# Patient Record
Sex: Male | Born: 1975 | Race: White | Hispanic: No | Marital: Married | Smoking: Never smoker
Health system: Southern US, Community
[De-identification: ages and names within clinical notes are randomized; demographics above are authoritative.]

---

## 1999-08-20 ENCOUNTER — Emergency Department (HOSPITAL_COMMUNITY): Admission: EM | Admit: 1999-08-20 | Discharge: 1999-08-21 | Payer: Self-pay | Admitting: Emergency Medicine

## 2004-01-07 ENCOUNTER — Emergency Department (HOSPITAL_COMMUNITY): Admission: EM | Admit: 2004-01-07 | Discharge: 2004-01-07 | Payer: Self-pay | Admitting: Emergency Medicine

## 2004-04-17 ENCOUNTER — Emergency Department (HOSPITAL_COMMUNITY): Admission: EM | Admit: 2004-04-17 | Discharge: 2004-04-18 | Payer: Self-pay | Admitting: Emergency Medicine

## 2005-08-09 IMAGING — CT CT HEAD W/O CM
1 of 2 series · 13 of 30 positions shown, 17 images · non-contrast
Comparison: none

CLINICAL DATA: 28-year-old male with headache and nausea. 
 CT HEAD, WITHOUT CONTRAST ? 04/18/04
 TECHNIQUE
 Standard noncontrast axial head CT.

[Series 2: brain · axial · 0.47mm/px · z∈[+146,+276]mm · 13 of 32 slices shown, 17 images]
[im 3/32  brain]
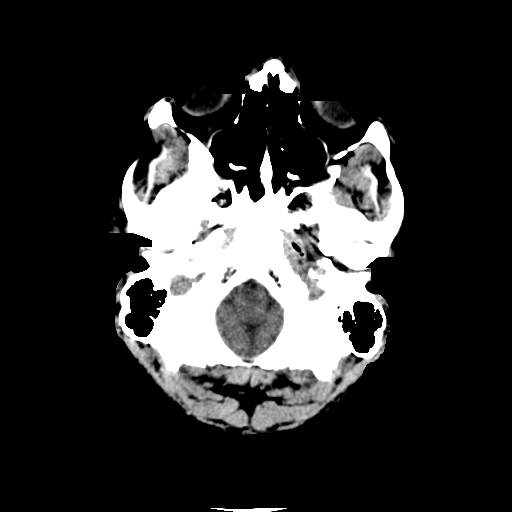
[im 3/32  bone]
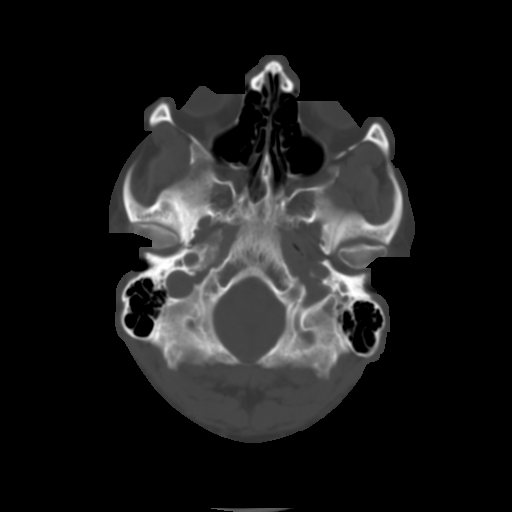
[im 5/32  brain]
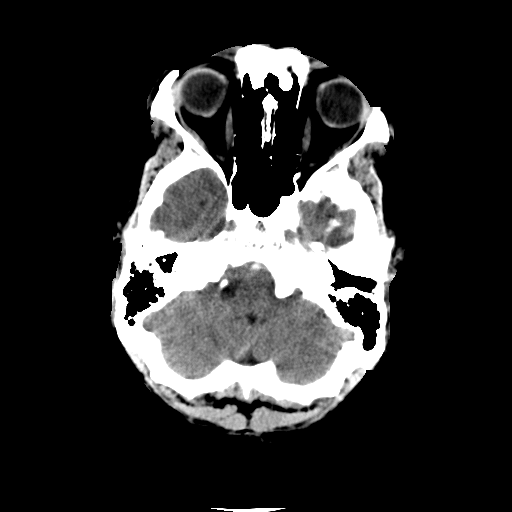
[im 7/32  brain]
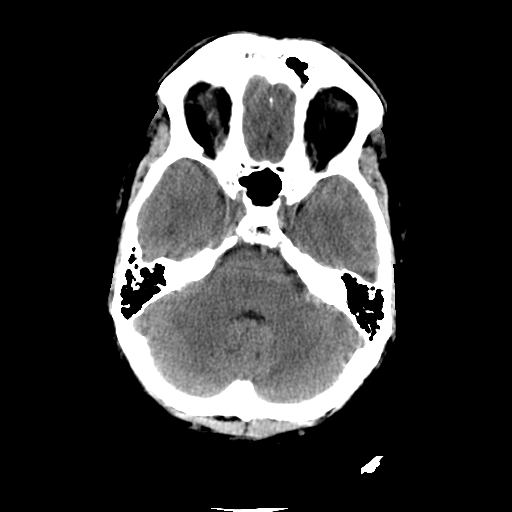
[im 9/32  brain]
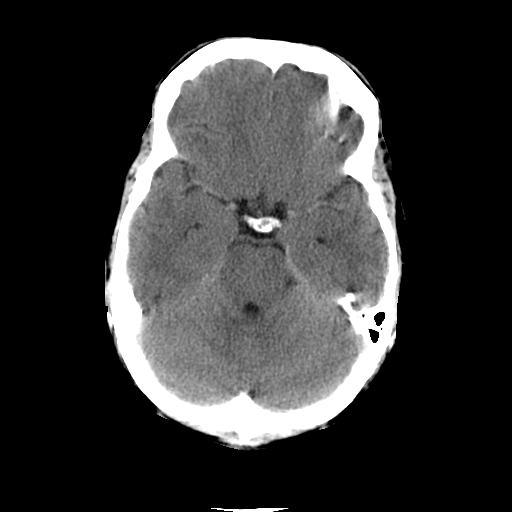
[im 12/32  brain]
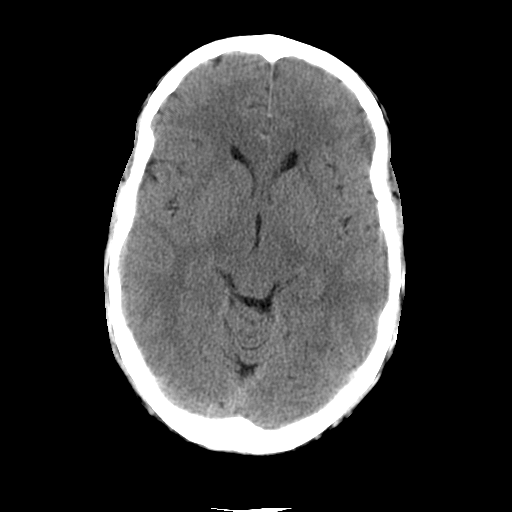
[im 12/32  bone]
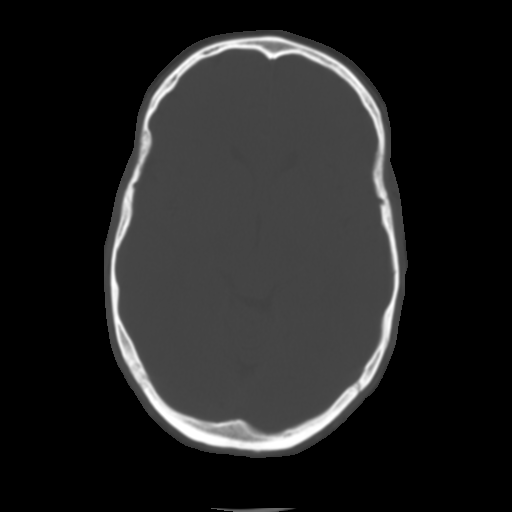
[im 14/32  brain]
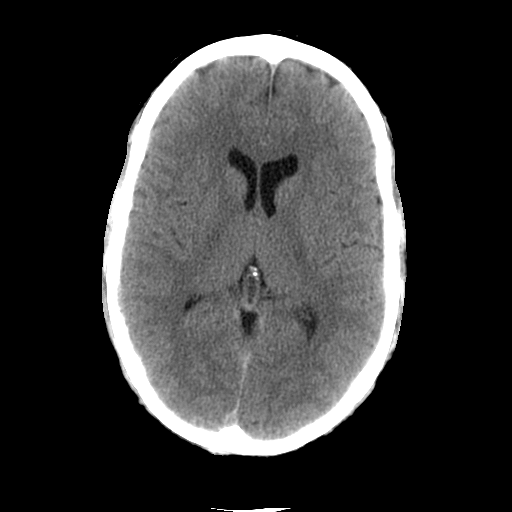
[im 16/32  brain]
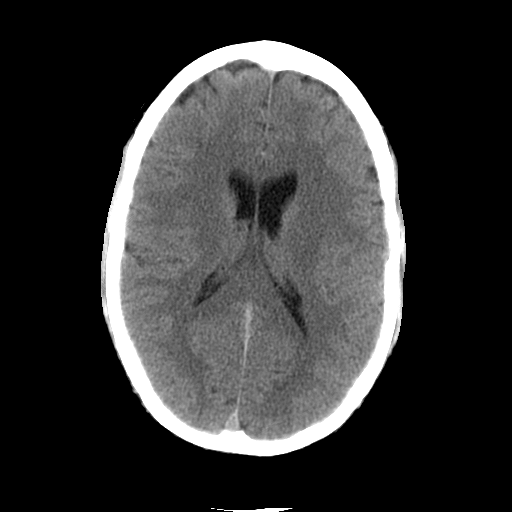
[im 18/32  brain]
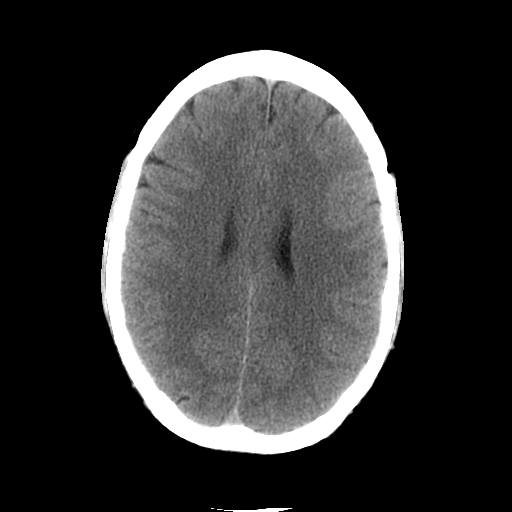
[im 20/32  brain]
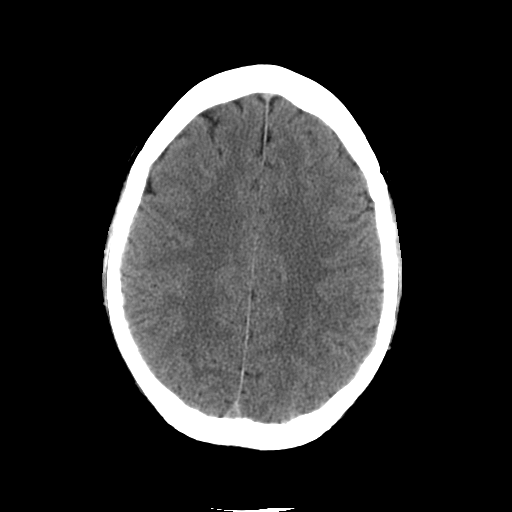
[im 20/32  bone]
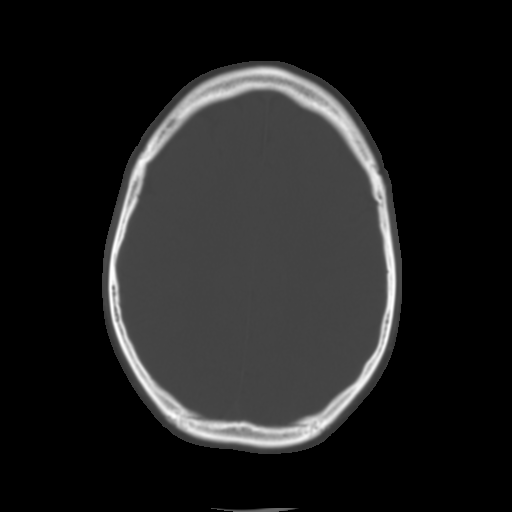
[im 23/32  brain]
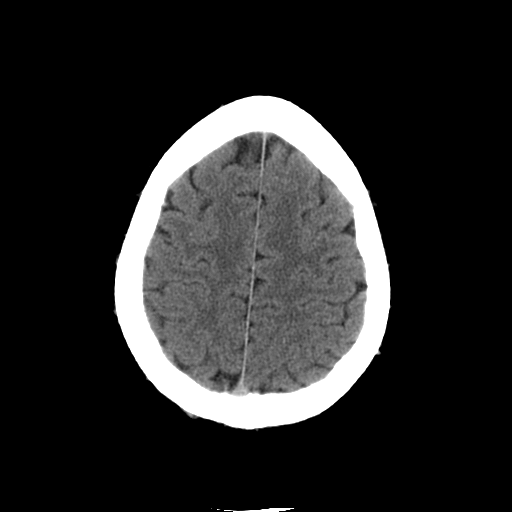
[im 25/32  brain]
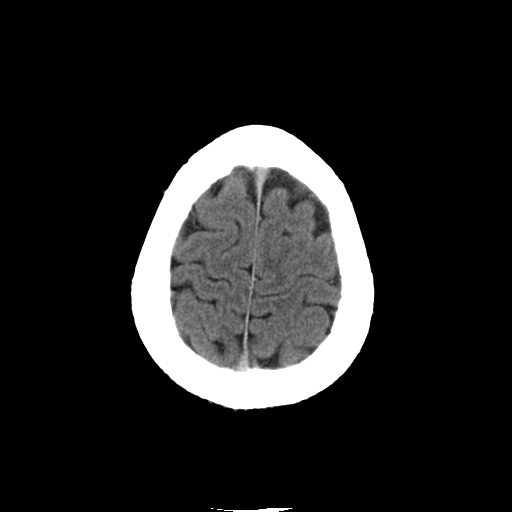
[im 27/32  brain]
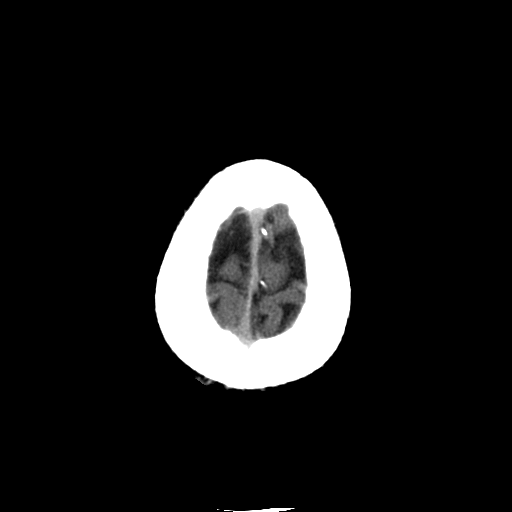
[im 29/32  brain]
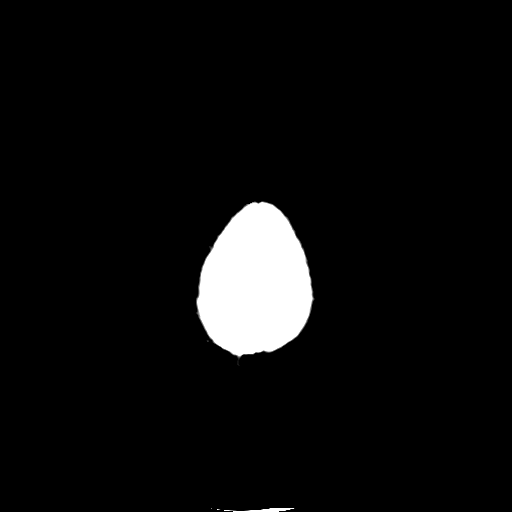
[im 29/32  bone]
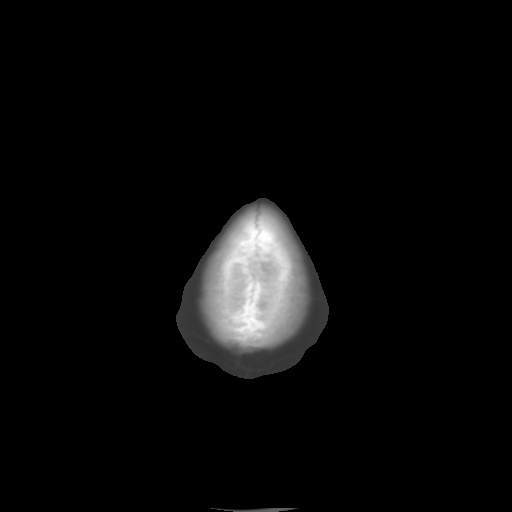

[13 of 30 positions shown; findings below may reference images not displayed]

FINDINGS: No acute intracranial edema, mass effect, hemorrhage, herniation, hydrocephalus, midline shift, or extra-axial fluid collection.  The ventricles are symmetric.  The cisterns are patent.  Gray and white matter differentiation is maintained.  The visualized sinuses and mastoids are clear. 
 IMPRESSION 
 No acute intracranial abnormality.

## 2006-10-02 ENCOUNTER — Emergency Department (HOSPITAL_COMMUNITY): Admission: EM | Admit: 2006-10-02 | Discharge: 2006-10-02 | Payer: Self-pay | Admitting: Emergency Medicine

## 2008-11-22 ENCOUNTER — Encounter: Payer: Self-pay | Admitting: Pulmonary Disease

## 2008-11-30 ENCOUNTER — Ambulatory Visit: Payer: Self-pay | Admitting: Pulmonary Disease

## 2008-11-30 DIAGNOSIS — R942 Abnormal results of pulmonary function studies: Secondary | ICD-10-CM | POA: Insufficient documentation

## 2013-04-03 ENCOUNTER — Ambulatory Visit
Admission: RE | Admit: 2013-04-03 | Discharge: 2013-04-03 | Disposition: A | Discharge status: Home / Self Care | Payer: No Typology Code available for payment source | Source: Ambulatory Visit | Attending: Occupational Medicine | Admitting: Occupational Medicine

## 2013-04-03 ENCOUNTER — Other Ambulatory Visit: Payer: Self-pay | Admitting: Occupational Medicine

## 2013-04-03 DIAGNOSIS — Z021 Encounter for pre-employment examination: Secondary | ICD-10-CM

## 2014-07-25 IMAGING — CR DG CHEST 1V
1 series · 1 of 1 positions shown · non-contrast
Comparison: 11/22/2008

CLINICAL DATA: Pre and employment, no chest complaints, nonsmoker,
initial encounter.

CHEST - 1 VIEW

[view not recorded]
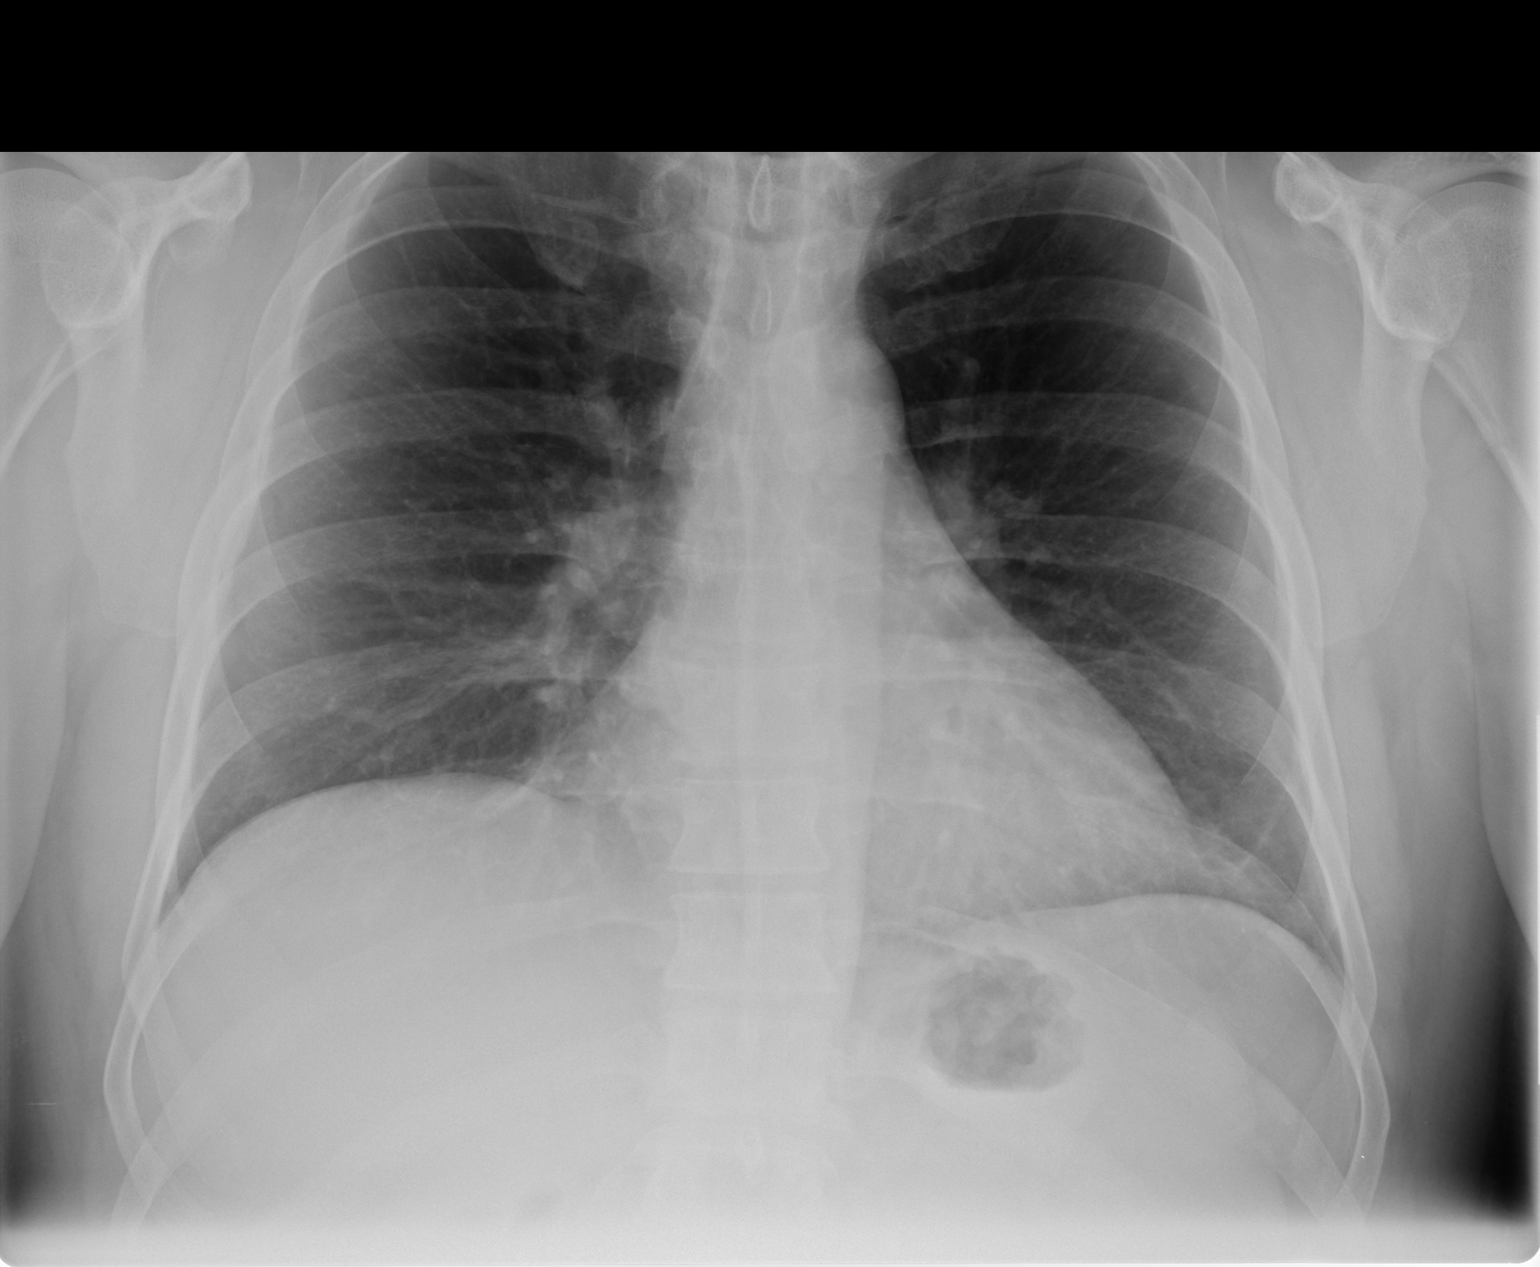

[1 of 1 positions shown; findings below may reference images not displayed]

FINDINGS: Grossly unchanged cardiac silhouette and mediastinal contours.
Bibasilar opacities are unchanged, left greater than right.  No new
focal airspace opacities.  No pleural effusion or pneumothorax.  No
evidence of edema.  No acute osseous abnormalities.
IMPRESSION: Bibasilar atelectasis without acute cardiopulmonary disease.

## 2016-08-07 ENCOUNTER — Ambulatory Visit (INDEPENDENT_AMBULATORY_CARE_PROVIDER_SITE_OTHER): Payer: Commercial Managed Care - HMO | Admitting: Orthopaedic Surgery

## 2016-08-07 ENCOUNTER — Encounter (INDEPENDENT_AMBULATORY_CARE_PROVIDER_SITE_OTHER): Payer: Self-pay

## 2016-08-07 ENCOUNTER — Encounter (INDEPENDENT_AMBULATORY_CARE_PROVIDER_SITE_OTHER): Payer: Self-pay | Admitting: Orthopaedic Surgery

## 2016-08-07 ENCOUNTER — Ambulatory Visit (INDEPENDENT_AMBULATORY_CARE_PROVIDER_SITE_OTHER): Payer: Commercial Managed Care - HMO

## 2016-08-07 VITALS — Ht 69.0 in | Wt 230.0 lb

## 2016-08-07 DIAGNOSIS — M722 Plantar fascial fibromatosis: Secondary | ICD-10-CM

## 2016-08-07 NOTE — Progress Notes (Addendum)
   Office Visit Note   Patient: Howard Wood           Date of Birth: [birth year]           MRN: [id number] Visit Date: 08/07/2016              Requested by: No referring provider defined for this encounter. PCP: No PCP Per Patient   Assessment & Plan: Visit Diagnoses:  1. Plantar fasciitis of right foot     Plan: Patient is right plantar fasciitis. Demonstrated home exercises and stretches for the patient. Patient is not symptomatic enough to warrant injection at this point. Follow-up with me as needed.  Follow-Up Instructions: Return if symptoms worsen or fail to improve.   Orders:  Orders Placed This Encounter  Procedures  . XR Os Calcis Right   No orders of the defined types were placed in this encounter.     Procedures: No procedures performed   Clinical Data: No additional findings.   Subjective: Chief Complaint  Patient presents with  . Right Foot - Pain    Right heel pain    Patient is 41 year old general male with right heel pain centrally that's been going on for several months. Pain is worse with the first few steps and then gets better as he walks more. Denies any Achilles pain denies any injuries.    Review of Systems  Constitutional: Negative.   All other systems reviewed and are negative.    Objective: Vital Signs: Ht 5\' 9"  (1.753 m)   Wt 230 lb (104.3 kg)   BMI 33.97 kg/m   Physical Exam  Constitutional: He is oriented to person, place, and time. He appears well-developed and well-nourished.  HENT:  Head: Normocephalic and atraumatic.  Eyes: Pupils are equal, round, and reactive to light.  Neck: Neck supple.  Pulmonary/Chest: Effort normal.  Abdominal: Soft.  Musculoskeletal: Normal range of motion.  Neurological: He is alert and oriented to person, place, and time.  Skin: Skin is warm.  Psychiatric: He has a normal mood and affect. His behavior is normal. Judgment and thought content normal.  Nursing note and vitals  reviewed.   Ortho Exam Exam of the right foot shows no swelling. He has mild tenderness centrally under the heel. Achilles is nontender. Slight Achilles contracture. Specialty Comments:  No specialty comments available.  Imaging: Xr Os Calcis Right  Result Date: 08/07/2016  No acute findings. Small plantar calcaneal osteophyte    PMFS History: Patient Active Problem List   Diagnosis Date Noted  . Plantar fasciitis of right foot 08/07/2016  . PULMONARY FUNCTION TESTS, ABNORMAL 11/30/2008   History reviewed. No pertinent past medical history.  History reviewed. No pertinent family history.  History reviewed. No pertinent surgical history. Social History   Occupational History  . Not on file.   Social History Main Topics  . Smoking status: Never Smoker  . Smokeless tobacco: Never Used  . Alcohol use Not on file  . Drug use: Unknown  . Sexual activity: Not on file

## 2016-08-08 NOTE — Addendum Note (Signed)
Addended by: XU, N MICHAEL on: 08/08/2016 08:17 PM   Modules accepted: Level of Service  

## 2018-07-31 ENCOUNTER — Encounter (INDEPENDENT_AMBULATORY_CARE_PROVIDER_SITE_OTHER): Payer: Self-pay | Admitting: Family Medicine

## 2018-07-31 ENCOUNTER — Ambulatory Visit (INDEPENDENT_AMBULATORY_CARE_PROVIDER_SITE_OTHER): Payer: 59 | Admitting: Family Medicine

## 2018-07-31 DIAGNOSIS — R1032 Left lower quadrant pain: Secondary | ICD-10-CM | POA: Diagnosis not present

## 2018-07-31 MED ORDER — SULFAMETHOXAZOLE-TRIMETHOPRIM 800-160 MG PO TABS: 1.0000 | ORAL_TABLET | Freq: Two times a day (BID) | ORAL | 0 refills | Status: DC

## 2018-07-31 MED ORDER — CELECOXIB 200 MG PO CAPS: 200.0000 mg | ORAL_CAPSULE | Freq: Two times a day (BID) | ORAL | 6 refills | Status: DC | PRN

## 2018-07-31 NOTE — Progress Notes (Signed)
   Office Visit Note   Patient: Howard Wood           Date of Birth: 09/04/1975           MRN: [id number] Visit Date: 07/31/2018 Requested by: No referring provider defined for this encounter. PCP: Patient, No Pcp Per  Subjective: Chief Complaint  Patient presents with  . left groin pain x 2 days, NKI    HPI: He was seen with left groin pain.  Symptoms started about a week ago.  Urinary frequency at first with no dysuria.  He had a low-grade fever 1 day during a respiratory illness, but he does not know whether it was related to his current symptoms.  Denies any back pain, hematuria.  No history of kidney stones personally but he does have a family history of it.  There was no injury to cause his current pain.  He is feeling much better in the last 2 days.              ROS: Otherwise noncontributory  Objective: Vital Signs: There were no vitals taken for this visit.  Physical Exam:  Left hip: No pain with flexion against resistance, passive internal/external rotation.  No palpable inguinal hernia.  There is slight tenderness in the epididymis, no testicular mass.  No visible rash.  No lymphadenopathy.  Imaging: None today.  Assessment & Plan: 1.  Left groin pain, suspect epididymitis.  Clinically improving. -Urinalysis to be sure he does not have microscopic hematuria to suggest possible kidney stone.  Prescription for Bactrim, but he does not have to take it if he continues to improve.  I will see him back as needed.   Follow-Up Instructions: No follow-ups on file.      Procedures: No procedures performed  No notes on file    PMFS History: Patient Active Problem List   Diagnosis Date Noted  . Plantar fasciitis of right foot 08/07/2016  . PULMONARY FUNCTION TESTS, ABNORMAL 11/30/2008   History reviewed. No pertinent past medical history.  History reviewed. No pertinent family history.  History reviewed. No pertinent surgical history. Social History   Occupational  History  . Not on file  Tobacco Use  . Smoking status: Never Smoker  . Smokeless tobacco: Never Used  Substance and Sexual Activity  . Alcohol use: Not on file  . Drug use: Not on file  . Sexual activity: Not on file

## 2018-08-01 ENCOUNTER — Telehealth (INDEPENDENT_AMBULATORY_CARE_PROVIDER_SITE_OTHER): Payer: Self-pay | Admitting: Family Medicine

## 2018-08-01 LAB — URINALYSIS, ROUTINE W REFLEX MICROSCOPIC
Bilirubin Urine: NEGATIVE
Glucose, UA: NEGATIVE
Hgb urine dipstick: NEGATIVE
Ketones, ur: NEGATIVE
Leukocytes, UA: NEGATIVE
Nitrite: NEGATIVE
Protein, ur: NEGATIVE
Specific Gravity, Urine: 1.022 (ref 1.001–1.03)
pH: <=5 (ref 5.0–8.0)

## 2018-08-01 LAB — URINE CULTURE
MICRO NUMBER:: 65332
Result:: NO GROWTH
SPECIMEN QUALITY:: ADEQUATE

## 2018-08-01 NOTE — Telephone Encounter (Signed)
I called and advised the patient. 

## 2018-08-01 NOTE — Telephone Encounter (Signed)
Urine was normal

## 2019-04-14 ENCOUNTER — Other Ambulatory Visit: Payer: Self-pay | Admitting: Family Medicine

## 2019-04-14 MED ORDER — TAMSULOSIN HCL 0.4 MG PO CAPS: 0.4000 mg | ORAL_CAPSULE | Freq: Every day | ORAL | 0 refills | Status: DC

## 2019-04-17 ENCOUNTER — Encounter: Payer: Self-pay | Admitting: Family Medicine

## 2019-04-17 ENCOUNTER — Other Ambulatory Visit: Payer: Self-pay

## 2019-04-17 ENCOUNTER — Ambulatory Visit (INDEPENDENT_AMBULATORY_CARE_PROVIDER_SITE_OTHER): Payer: 59 | Admitting: Family Medicine

## 2019-04-17 VITALS — BP 138/96 | HR 86 | Temp 98.3°F

## 2019-04-17 DIAGNOSIS — Z87448 Personal history of other diseases of urinary system: Secondary | ICD-10-CM | POA: Diagnosis not present

## 2019-04-17 DIAGNOSIS — R109 Unspecified abdominal pain: Secondary | ICD-10-CM

## 2019-04-17 DIAGNOSIS — Z87898 Personal history of other specified conditions: Secondary | ICD-10-CM

## 2019-04-17 NOTE — Progress Notes (Signed)
   Office Visit Note   Patient: Howard Wood           Date of Birth: 19-Mar-1976           MRN: [id number] Visit Date: 04/17/2019 Requested by: No referring provider defined for this encounter. PCP: Patient, No Pcp Per  Subjective: Chief Complaint  Patient presents with  . Lower Back - Pain    Left lower back pain x 2 weeks. No injury. When pain is more severe, he has noticed a bit of blood in the urine.    HPI: He is here with left-sided flank pain.  Symptoms started about 2 weeks ago.  He woke up from sleep with severe pain, no injury the day before.  He drank a lot of water and symptoms improved but he did notice some gross hematuria that day.  He seemed to be getting better but then he had an episode of chills and increased flank pain about a week ago.  Once again his symptoms improved but he still has a low level of left-sided flank pain with occasional radiation to the groin area.  He has not noticed any hematuria recently.  He does have a family history of kidney stones in multiple family members but he has never had one himself.  I called in Flomax a couple days ago and he has taken 3 doses.  Yesterday he felt almost normal again.              ROS:   All other systems were reviewed and are negative.  Objective: Vital Signs: BP (!) 138/96 (BP Location: Left Arm, Patient Position: Sitting, Cuff Size: Large)   Pulse 86   Temp 98.3 F (36.8 C)   Physical Exam:  General:  Alert and oriented, in no acute distress. Pulm:  Breathing unlabored. Psy:  Normal mood, congruent affect. Skin: No rash on his skin. Low back: No significant tenderness to palpation in the CVA or in the abdominal area.  Imaging: None today.  Assessment & Plan: 1.  Improving left-sided flank pain with history of gross hematuria, concerning for nephrolithiasis. -We will obtain a urinalysis today.  His symptoms are very manageable right now so we will continue with Flomax and hydration efforts.  If he has  another severe flareup, we will order a CT scan.     Procedures: No procedures performed  No notes on file     PMFS History: Patient Active Problem List   Diagnosis Date Noted  . Plantar fasciitis of right foot 08/07/2016  . PULMONARY FUNCTION TESTS, ABNORMAL 11/30/2008   History reviewed. No pertinent past medical history.  History reviewed. No pertinent family history.  History reviewed. No pertinent surgical history. Social History   Occupational History  . Not on file  Tobacco Use  . Smoking status: Never Smoker  . Smokeless tobacco: Never Used  Substance and Sexual Activity  . Alcohol use: Not on file  . Drug use: Not on file  . Sexual activity: Not on file

## 2019-04-18 ENCOUNTER — Telehealth: Payer: Self-pay | Admitting: Family Medicine

## 2019-04-18 LAB — URINALYSIS W MICROSCOPIC + REFLEX CULTURE
Bacteria, UA: NONE SEEN /HPF
Bilirubin Urine: NEGATIVE
Glucose, UA: NEGATIVE
Hyaline Cast: NONE SEEN /LPF
Ketones, ur: NEGATIVE
Leukocyte Esterase: NEGATIVE
Nitrites, Initial: NEGATIVE
Protein, ur: NEGATIVE
Specific Gravity, Urine: 1.024 (ref 1.001–1.03)
Squamous Epithelial / HPF: NONE SEEN /HPF (ref <=5)
WBC, UA: NONE SEEN /HPF (ref 0–5)
pH: 7 (ref 5.0–8.0)

## 2019-04-18 LAB — NO CULTURE INDICATED

## 2019-04-18 NOTE — Telephone Encounter (Signed)
Urine shows blood, so kidney stone is likely.  If pain doesn't improve, we'll order a CT scan.

## 2019-08-24 ENCOUNTER — Other Ambulatory Visit (INDEPENDENT_AMBULATORY_CARE_PROVIDER_SITE_OTHER): Payer: Self-pay | Admitting: Family Medicine

## 2020-07-04 ENCOUNTER — Emergency Department (HOSPITAL_COMMUNITY): Payer: 59

## 2020-07-04 ENCOUNTER — Encounter (HOSPITAL_COMMUNITY): Payer: Self-pay | Admitting: *Deleted

## 2020-07-04 ENCOUNTER — Other Ambulatory Visit: Payer: Self-pay

## 2020-07-04 ENCOUNTER — Emergency Department (HOSPITAL_COMMUNITY)
Admission: EM | Admit: 2020-07-04 | Discharge: 2020-07-04 | Disposition: A | Discharge status: Home / Self Care | Payer: 59 | Attending: Emergency Medicine | Admitting: Emergency Medicine

## 2020-07-04 DIAGNOSIS — R079 Chest pain, unspecified: Secondary | ICD-10-CM | POA: Diagnosis not present

## 2020-07-04 LAB — BASIC METABOLIC PANEL
Anion gap: 10 (ref 5–15)
BUN: 16 mg/dL (ref 6–20)
CO2: 27 mmol/L (ref 22–32)
Calcium: 9.4 mg/dL (ref 8.9–10.3)
Chloride: 101 mmol/L (ref 98–111)
Creatinine, Ser: 1.19 mg/dL (ref 0.61–1.24)
GFR, Estimated: >60 mL/min (ref >60)
Glucose, Bld: 118 mg/dL — ABNORMAL HIGH (ref 70–99)
Potassium: 4.5 mmol/L (ref 3.5–5.1)
Sodium: 138 mmol/L (ref 135–145)

## 2020-07-04 LAB — CBC
HCT: 46.1 % (ref 39.0–52.0)
Hemoglobin: 15.4 g/dL (ref 13.0–17.0)
MCH: 29.7 pg (ref 26.0–34.0)
MCHC: 33.4 g/dL (ref 30.0–36.0)
MCV: 88.8 fL (ref 80.0–100.0)
Platelets: 259 10*3/uL (ref 150–400)
RBC: 5.19 MIL/uL (ref 4.22–5.81)
RDW: 12.2 % (ref 11.5–15.5)
WBC: 7 10*3/uL (ref 4.0–10.5)
nRBC: 0 % (ref 0.0–0.2)

## 2020-07-04 LAB — TROPONIN I (HIGH SENSITIVITY)
Troponin I (High Sensitivity): 3 ng/L (ref <18)
Troponin I (High Sensitivity): 3 ng/L (ref <18)

## 2020-07-04 NOTE — ED Triage Notes (Signed)
Pt reports left side chest pain that woke him up. Describes it as dull pain that radiates to his back. No acute distress noted at triage. Denies cardiac hx.

## 2020-07-04 NOTE — Discharge Instructions (Addendum)
Call your primary care doctor or specialist as discussed in the next 2-3 days.   Return immediately back to the ER if:  Your symptoms worsen within the next 12-24 hours. You develop new symptoms such as new fevers, persistent vomiting, new pain, shortness of breath, or new weakness or numbness, or if you have any other concerns.  

## 2020-07-04 NOTE — ED Notes (Signed)
Patient verbalizes understanding of discharge instructions. Opportunity for questioning and answers were provided. Arm band removed by staff, patient discharged from ED. 

## 2020-07-04 NOTE — ED Provider Notes (Signed)
[facility] EMERGENCY DEPARTMENT Provider Note   CSN: [id number] Arrival date & time: 07/04/20  0242     History Chief Complaint  Patient presents with   Chest Pain    Howard Wood is a 44 y.o. male.  Patient is a IT sales professional, presents with chief complaint of left-sided chest pain.  Describes it as a dull persistent ache since about 12 AM last night.  He states it never really went away.  Is worse when he pushes on the area.  Denies any palpitations or diaphoresis or shortness of breath.  He had a similar episode of pain in that area several months ago but it resolved on its own.  Denies any prior cardiac history.  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        History reviewed. No pertinent past medical history.  Patient Active Problem List   Diagnosis Date Noted   Plantar fasciitis of right foot 08/07/2016   PULMONARY FUNCTION TESTS, ABNORMAL 11/30/2008    History reviewed. No pertinent surgical history.     History reviewed. No pertinent family history.  Social History   Tobacco Use   Smoking status: Never Smoker   Smokeless tobacco: Never Used    Home Medications Prior to Admission medications   Medication Sig Start Date End Date Taking? Authorizing Provider  celecoxib (CELEBREX) 200 MG capsule TAKE 1 CAPSULE BY MOUTH TWICE DAILY 08/24/19   Hilts, Casimiro Needle, MD  ibuprofen (ADVIL,MOTRIN) 200 MG tablet Take 200 mg by mouth every 6 (six) hours as needed.    [provider]  tamsulosin (FLOMAX) 0.4 MG CAPS capsule Take 1 capsule (0.4 mg total) by mouth daily. 04/14/19   Hilts, Casimiro Needle, MD    Allergies    Patient has no known allergies.  Review of Systems   Review of Systems  Constitutional: Negative for fever.  HENT: Negative for ear pain and sore throat.   Eyes: Negative for pain.  Respiratory: Negative for cough.    Cardiovascular: Positive for chest pain.  Gastrointestinal: Negative for abdominal pain.  Genitourinary: Negative for flank pain.  Musculoskeletal: Negative for back pain.  Skin: Negative for color change and rash.  Neurological: Negative for syncope.  All other systems reviewed and are negative.   Physical Exam Updated Vital Signs BP (!) 120/91    Pulse 78    Temp 97.8 F (36.6 C) (Oral)    Resp 19    SpO2 98%   Physical Exam Constitutional:      General: He is not in acute distress.    Appearance: He is well-developed.  HENT:     Head: Normocephalic.     Mouth/Throat:     Mouth: Mucous membranes are moist.  Cardiovascular:     Rate and Rhythm: Normal rate.  Pulmonary:     Effort: Pulmonary effort is normal.  Abdominal:     Palpations: Abdomen is soft.  Musculoskeletal:     Right lower leg: No edema.     Left lower leg: No edema.  Skin:    General: Skin is warm.     Capillary Refill: Capillary refill takes less than 2 seconds.  Neurological:     General: No focal deficit present.     Mental Status: He is alert.     ED Results / Procedures / Treatments   Labs (all labs ordered are listed, but  only abnormal results are displayed) Labs Reviewed  BASIC METABOLIC PANEL - Abnormal; Notable for the following components:      Result Value   Glucose, Bld 118 (*)    All other components within normal limits  CBC  TROPONIN I (HIGH SENSITIVITY)  TROPONIN I (HIGH SENSITIVITY)    EKG EKG Interpretation  Date/Time:  Monday July 04 2020 02:46:32 EST Ventricular Rate:  91 PR Interval:  162 QRS Duration: 92 QT Interval:  376 QTC Calculation: 462 R Axis:   74 Text Interpretation: Normal sinus rhythm Normal ECG Confirmed by Norman Clay ([id number]) on 07/04/2020 8:11:57 AM   Radiology DG Chest 2 View  Result Date: 07/04/2020 CLINICAL DATA:  Chest pain EXAM: CHEST - 2 VIEW COMPARISON:  April 03, 2013 FINDINGS: Lungs are clear. Heart size and pulmonary  vascularity are normal. No adenopathy. No pneumothorax. No bone lesions. IMPRESSION: Lungs clear.  Cardiac silhouette normal. Electronically Signed   By: Bretta Bang III M.D.   On: 07/04/2020 08:37    Procedures Procedures (including critical care time)  Medications Ordered in ED Medications - No data to display  ED Course  I have reviewed the triage vital signs and the nursing notes.  Pertinent labs & imaging results that were available during my care of the patient were reviewed by me and considered in my medical decision making (see chart for details).    MDM Rules/Calculators/A&P                          Initial vital signs within normal limits.  EKG within normal limits as well no ST elevations or depressions noted.  Initial troponin is negative, pending repeat troponin.  I discussed with patient and his family the limitations of cardiac evaluation in emergency department.  Will advise rest and close follow-up with cardiology within the week.  Advising immediate return if he has recurrent or worsening chest pain shortness of breath or any additional concerns.   Final Clinical Impression(s) / ED Diagnoses Final diagnoses:  Chest pain, unspecified type    Rx / DC Orders ED Discharge Orders    None       Cheryll Cockayne, MD 07/04/20 1040

## 2020-07-07 ENCOUNTER — Ambulatory Visit (INDEPENDENT_AMBULATORY_CARE_PROVIDER_SITE_OTHER): Payer: 59 | Admitting: Family Medicine

## 2020-07-07 ENCOUNTER — Other Ambulatory Visit: Payer: Self-pay

## 2020-07-07 ENCOUNTER — Encounter: Payer: Self-pay | Admitting: Family Medicine

## 2020-07-07 DIAGNOSIS — R0789 Other chest pain: Secondary | ICD-10-CM | POA: Diagnosis not present

## 2020-07-07 DIAGNOSIS — M25512 Pain in left shoulder: Secondary | ICD-10-CM | POA: Diagnosis not present

## 2020-07-07 MED ORDER — BACLOFEN 10 MG PO TABS: 5.0000 mg | ORAL_TABLET | Freq: Three times a day (TID) | ORAL | 1 refills | Status: DC | PRN

## 2020-07-07 MED ORDER — BUSPIRONE HCL 7.5 MG PO TABS: 7.5000 mg | ORAL_TABLET | Freq: Two times a day (BID) | ORAL | 3 refills | Status: DC | PRN

## 2020-07-07 MED ORDER — VITAMIN D-3 125 MCG (5000 UT) PO TABS: 1.0000 | ORAL_TABLET | Freq: Every day | ORAL | 3 refills | Status: AC

## 2020-07-07 NOTE — Progress Notes (Signed)
   Office Visit Note   Patient: Howard Wood           Date of Birth: 1976/04/27           MRN: [id number] Visit Date: 07/07/2020 Requested by: No referring provider defined for this encounter. PCP: Patient, No Pcp Per  Subjective: Chief Complaint  Patient presents with  . Left Shoulder - Pain    Pain in the anterior shoulder, upper left chest and through to the back. The shoulder has had a dull ache in it at night for about a month. Was seen in the ED for this 07/04/20.     HPI: He is here with left shoulder and chest pain.  For about a month he has been having some pain in his shoulder, mostly on the superior and posterior aspect with occasional radiation into his left arm.  He has had troubles with myofascial pain in the past and this felt very similar.  But then the other morning he woke up with anterior chest pain that did not go away so he went to the ER where he was evaluated for cardiac issues and evaluation was negative.  His chest wall pain has improved but is still slightly present.  He has not taken anything for it.  There is no significant family history of heart disease, he is not a smoker.  He has had a little anxiety trouble lately, he is not sure why.               ROS: No shortness of breath, diaphoresis.  All other systems were reviewed and are negative.  Objective: Vital Signs: There were no vitals taken for this visit.  Physical Exam:  General:  Alert and oriented, in no acute distress. Pulm:  Breathing unlabored. Psy:  Normal mood, congruent affect. Skin: No visible rash Left shoulder: Full active range of motion.  Full neck range of motion.  He sits with head forward posture.  Spurling's test negative.  Upper extremity strength and reflexes are normal.  Tender myofascial trigger points in the rhomboid region and the trapezius belly.  Slightly tender over the pectoralis muscle near the axilla.   Imaging: No results found.  Assessment & Plan: 1.  Probable  myofascial shoulder and neck pain. -We will try physical therapy.  Vitamin D.  Baclofen as needed. -BuSpar as needed for anxiety. -He is already set up for cardiology consultation in the near future.     Procedures: No procedures performed        PMFS History: Patient Active Problem List   Diagnosis Date Noted  . Plantar fasciitis of right foot 08/07/2016  . PULMONARY FUNCTION TESTS, ABNORMAL 11/30/2008   History reviewed. No pertinent past medical history.  History reviewed. No pertinent family history.  History reviewed. No pertinent surgical history. Social History   Occupational History  . Not on file  Tobacco Use  . Smoking status: Never Smoker  . Smokeless tobacco: Never Used  Substance and Sexual Activity  . Alcohol use: Not on file  . Drug use: Not on file  . Sexual activity: Not on file

## 2020-07-14 ENCOUNTER — Other Ambulatory Visit: Payer: Self-pay | Admitting: Family Medicine

## 2020-07-14 DIAGNOSIS — R0789 Other chest pain: Secondary | ICD-10-CM

## 2020-07-14 MED ORDER — ESCITALOPRAM OXALATE 5 MG PO TABS: ORAL_TABLET | ORAL | 3 refills | Status: AC

## 2020-07-14 MED ORDER — ALPRAZOLAM 0.25 MG PO TABS: ORAL_TABLET | ORAL | 0 refills | Status: AC

## 2020-07-20 ENCOUNTER — Ambulatory Visit: Payer: 59 | Admitting: Cardiovascular Disease

## 2020-07-20 ENCOUNTER — Ambulatory Visit: Payer: Self-pay | Admitting: Cardiology

## 2020-07-20 ENCOUNTER — Other Ambulatory Visit: Payer: Self-pay

## 2020-07-20 VITALS — BP 127/72 | HR 88 | Temp 98.2°F | Ht 70.0 in | Wt 255.4 lb

## 2020-07-20 DIAGNOSIS — Z79899 Other long term (current) drug therapy: Secondary | ICD-10-CM

## 2020-07-20 DIAGNOSIS — R0789 Other chest pain: Secondary | ICD-10-CM

## 2020-07-20 DIAGNOSIS — R0683 Snoring: Secondary | ICD-10-CM

## 2020-07-20 DIAGNOSIS — E782 Mixed hyperlipidemia: Secondary | ICD-10-CM

## 2020-07-20 DIAGNOSIS — G4733 Obstructive sleep apnea (adult) (pediatric): Secondary | ICD-10-CM

## 2020-07-20 NOTE — Patient Instructions (Signed)
Medication Instructions:  No changes at this time *If you need a refill on your cardiac medications before your next appointment, please call your pharmacy*   Lab Work: Fasting lipid profile If you have labs (blood work) drawn today and your tests are completely normal, you will receive your results only by: Marland Kitchen MyChart Message (if you have MyChart) OR . A paper copy in the mail If you have any lab test that is abnormal or we need to change your treatment, we will call you to review the results.   Testing/Procedures: Your physician has recommended that you have a sleep study. This test records several body functions during sleep, including: brain activity, eye movement, oxygen and carbon dioxide blood levels, heart rate and rhythm, breathing rate and rhythm, the flow of air through your mouth and nose, snoring, body muscle movements, and chest and belly movement. Our sleep coordinators will call you with more information regarding this study.  Your physician has requested that you have an exercise tolerance test. For further information please visit [email or web address] Please also follow instruction sheet, as given. This will take place at [address] [facility], [address].  Do not drink or eat foods with caffeine for 24 hours before the test. (Chocolate, coffee, tea, or energy drinks)  If you use an inhaler, bring it with you to the test.  Do not smoke for 4 hours before the test.  Wear comfortable shoes and clothing.    Follow-Up: At [facility], you and your health needs are our priority.  As part of our continuing mission to provide you with exceptional heart care, we have created designated Provider Care Teams.  These Care Teams include your primary Cardiologist (physician) and Advanced Practice Providers (APPs -  Physician Assistants and Nurse Practitioners) who all work together to provide you with the care you need, when you need it.  We recommend signing up for the patient  portal called "MyChart".  Sign up information is provided on this After Visit Summary.  MyChart is used to connect with patients for Virtual Visits (Telemedicine).  Patients are able to view lab/test results, encounter notes, upcoming appointments, etc.  Non-urgent messages can be sent to your provider as well.   To learn more about what you can do with MyChart, go to [email or web address].    Your next appointment:   We will follow up with you based on the results of these tests   Other Instructions None

## 2020-07-21 ENCOUNTER — Telehealth: Payer: Self-pay | Admitting: *Deleted

## 2020-07-21 NOTE — Telephone Encounter (Signed)
-----   Message from Delray Alt, RN sent at 07/20/2020  2:53 PM EST ----- Regarding: Home Sleep Test Good afternoon,  This pt of Dr. Tresa Endo has been ordered a home sleep test.  Please let me know if you have any questions,  Maralyn Sago

## 2020-07-22 ENCOUNTER — Encounter: Payer: Self-pay | Admitting: Cardiovascular Disease

## 2020-07-22 ENCOUNTER — Telehealth: Payer: Self-pay | Admitting: Cardiovascular Disease

## 2020-07-22 NOTE — Progress Notes (Signed)
Cardiology Office Note    Date:  07/22/2020   ID:  Howard Wood, DOB January 03, 1976, MRN [id number]  PCP:  Patient, No Pcp Per  Cardiologist:  Nicki Guadalajara, MD   New cardiology evaluation referred through the courtesy of Dr. Lavada Mesi and Dr. Norman Clay from a July 04, 2020 Howard Wood, ER evaluation.  History of Present Illness:  Howard Wood is a 45 y.o. male who is the son-in-law of my patient Howard Wood.  He had recently developed left shoulder vague chest pain.  He is referred to the courtesy of Dr. Lavada Mesi for cardiology evaluation.  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  He denies any known cardiac history.  Recently, he has developed left shoulder symptoms with some vague chest pain.  He presented to [facility] emergency room on July 04, 2020.  Troponins were negative.  There was no exertional component.  He denied palpitations or diaphoresis or shortness of breath.  He was prescribed Celebrex.  He was seen by Dr. Lavada Mesi in follow-up on July 07, 2020 who felt most likely the pain was myofascial shoulder and neck discomfort.  Physical therapy was initially recommended.  The patient admits to tenderness over the anterior left shoulder.  He denies any exertional chest pain symptoms.  He denies any exertional shortness of breath.  Upon further questioning, the patient has been witnessed to wake up in a panic attack.  According to his wife he does snore and he often does not sleep well.  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  The patient had lipid studies drawn in August 2021 which showed dyslipidemia with a total cholesterol of 226, triglycerides 271, and LDL cholesterol of 144.  HDL was 40.  He recently was put on baclofen and has taken Celebrex daily for the discomfort.  His wife's mother is my patient Forest Wood presents for cardiology evaluation.  Past history is notable for hyperlipidemia.  At times his  blood pressure has been elevated.  Past surgical history is notable for tonsillectomy.  Current Medications: Outpatient Medications Prior to Visit  Medication Sig Dispense Refill  . ALPRAZolam (XANAX) 0.25 MG tablet Take 1/2 - 1 PO BID prn 20 tablet 0  . baclofen (LIORESAL) 10 MG tablet Take 0.5-1 tablets (5-10 mg total) by mouth 3 (three) times daily as needed for muscle spasms. 30 each 1  . celecoxib (CELEBREX) 200 MG capsule TAKE 1 CAPSULE BY MOUTH TWICE DAILY 60 capsule 6  . Cholecalciferol (VITAMIN D-3) 125 MCG (5000 UT) TABS Take 1 tablet by mouth daily. 90 tablet 3  . escitalopram (LEXAPRO) 5 MG tablet 1 PO qd, may increase to 2 PO qd if needed 60 tablet 3  . ibuprofen (ADVIL,MOTRIN) 200 MG tablet Take 200 mg by mouth every 6 (six) hours as needed.    . busPIRone (BUSPAR) 7.5 MG tablet Take 1 tablet (7.5 mg total) by mouth 2 (two) times daily as needed. 60 tablet 3   No facility-administered medications prior to visit.     Allergies:   Patient has no known allergies.   Social History   Socioeconomic History  . Marital status: Married    Spouse name: Not on file  . Number of children: Not on file  . Years of education: Not on file  . Highest education level: Not on file  Occupational History  . Not on file  Tobacco Use  .  Smoking status: Never Smoker  . Smokeless tobacco: Never Used  Substance and Sexual Activity  . Alcohol use: Not on file  . Drug use: Not on file  . Sexual activity: Not on file  Other Topics Concern  . Not on file  Social History Narrative  . Not on file   Social Determinants of Health   Financial Resource Strain: Not on file  Food Insecurity: Not on file  Transportation Needs: Not on file  Physical Activity: Not on file  Stress: Not on file  Social Connections: Not on file    Socially, he was born in [place].  He completed 12th grade of education.  He is married for 23 years and has 3 children ages 17, 94 and 60.  He works for [employer].  He does drink approximately 10 beers per week.  There is no tobacco use.   Family History: His mother is deceased at age 24 and had brain cancer and a stroke.  Father is living at age 109.  He has 1 brother age 68.  ROS General: Negative; No fevers, chills, or night sweats;  HEENT: Negative; No changes in vision or hearing, sinus congestion, difficulty swallowing Pulmonary: Negative; No cough, wheezing, shortness of breath, hemoptysis Cardiovascular: Negative; No chest pain, presyncope, syncope, palpitations GI: Negative; No nausea, vomiting, diarrhea, or abdominal pain GU: Negative; No dysuria, hematuria, or difficulty voiding Musculoskeletal: Left shoulder discomfort Hematologic/Oncology: Negative; no easy bruising, bleeding Endocrine: Negative; no heat/cold intolerance; no diabetes Neuro: Negative; no changes in balance, headaches Skin: Negative; No rashes or skin lesions Psychiatric: Negative; No behavioral problems, depression Sleep: Negative; No snoring, daytime sleepiness, hypersomnolence, bruxism, restless legs, hypnogognic hallucinations, no cataplexy Other comprehensive 14 point system review is negative.   PHYSICAL EXAM:   VS:  BP 127/72   Pulse 88   Temp 98.2 F (36.8 C)   Ht 5\' 10"  (1.778 m)   Wt 255 lb 6.4 oz (115.8 kg)   SpO2 98%   BMI 36.65 kg/m     Repeat blood pressure by me was 124/74  Wt Readings from Last 3 Encounters:  07/20/20 255 lb 6.4 oz (115.8 kg)  08/07/16 230 lb (104.3 kg)    General: Alert, oriented, no distress.  Skin: normal turgor, no rashes, warm and dry HEENT: Normocephalic, atraumatic. Pupils equal round and reactive to light; sclera anicteric; extraocular muscles intact;  Nose without nasal septal hypertrophy Mouth/Parynx benign; Mallinpatti scale 3/4 Neck: No JVD, no carotid bruits; normal carotid upstroke Lungs: clear to ausculatation and percussion; no wheezing or rales Chest wall: without tenderness to  palpitation Heart: PMI not displaced, RRR, s1 s2 normal, 1/6 systolic murmur, no diastolic murmur, no rubs, gallops, thrills, or heaves Abdomen: soft, nontender; no hepatosplenomehaly, BS+; abdominal aorta nontender and not dilated by palpation. Back: no CVA tenderness Pulses 2+ Musculoskeletal: Tenderness over the left shoulder pectoral tendon; no costochondral tenderness Extremities: no clubbing cyanosis or edema, Homan's sign negative  Neurologic: grossly nonfocal; Cranial nerves grossly wnl Psychologic: Normal mood and affect   Studies/Labs Reviewed:   EKG:  EKG is not  ordered today.  I personally reviewed the ECG from July 04, 2020 which showed normal sinus rhythm at 91 bpm.  There is no ectopy.  There are no ST segment changes.  QTc interval 462 ms.  Recent Labs: BMP Latest Ref Rng & Units 07/04/2020  Glucose 70 - 99 mg/dL 118(H)  BUN 6 - 20 mg/dL 16  Creatinine 0.61 - 1.24 mg/dL 1.19  Sodium 135 - 145 mmol/L 138  Potassium 3.5 - 5.1 mmol/L 4.5  Chloride 98 - 111 mmol/L 101  CO2 22 - 32 mmol/L 27  Calcium 8.9 - 10.3 mg/dL 9.4     No flowsheet data found.  CBC Latest Ref Rng & Units 07/04/2020  WBC 4.0 - 10.5 K/uL 7.0  Hemoglobin 13.0 - 17.0 g/dL 08.6  Hematocrit 57.8 - 52.0 % 46.1  Platelets 150 - 400 K/uL 259   Lab Results  Component Value Date   MCV 88.8 07/04/2020   No results found for: TSH No results found for: HGBA1C   BNP No results found for: BNP  ProBNP No results found for: PROBNP   Lipid Panel  No results found for: CHOL, TRIG, HDL, CHOLHDL, VLDL, LDLCALC, LDLDIRECT, LABVLDL   RADIOLOGY: DG Chest 2 View  Result Date: 07/04/2020 CLINICAL DATA:  Chest pain EXAM: CHEST - 2 VIEW COMPARISON:  April 03, 2013 FINDINGS: Lungs are clear. Heart size and pulmonary vascularity are normal. No adenopathy. No pneumothorax. No bone lesions. IMPRESSION: Lungs clear.  Cardiac silhouette normal. Electronically Signed   By: Bretta Bang III M.D.    On: 07/04/2020 08:37     Additional studies/ records that were reviewed today include:  I have reviewed the records from his [facility] emergency room evaluation as well as records of Dr. Lavada Mesi.  ASSESSMENT:    1. Chest wall pain   2. Snoring   3. Evaluate for possible OSA (obstructive sleep apnea)   4. Mixed hyperlipidemia   5. Medication management     PLAN:  Howard Wood is a 45 year old healthy-appearing gentleman who has experienced left-sided chest/shoulder discomfort which was described as a dull persistent ache which ultimately led to his Howard Wood emergency room evaluation on July 04, 2020.  Cardiac troponins were negative and laboratory was otherwise unremarkable.  At the time his blood pressure was 120/91 and his pulse was 78.  ECG was unremarkable.  On exam he has definite tenderness to palpation over the pectoral tendon and when seen by Dr. Prince Rome he was felt to have myofascial shoulder and neck pain.  His pain has improved but he still has definite tenderness to palpation on physical examination.  I strongly suspect his chest pain is not of cardiac etiology and most likely is musculoskeletal.  He is now on medication for muscle spasm as well as antiinflammatory therapy with improvement.  However, the patient has awakened from sleep with almost panic attacks.  According to his wife he snores.  His sleep is not restorative.  In addition, lipid studies performed in August 2021 were abnormal with a total cholesterol of 226, triglycerides 271, and LDL cholesterol at 144.  I am recommending follow-up fasting lipid panel and I discussed with him literature regarding subclinical atherosclerosis.  In light of his chest pain, I will schedule him for a routine treadmill test which will be helpful to hopefully provide reassurance that his chest pain is nonischemic.  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  I also raise  concerns for possible sleep apnea.  I will schedule him for home sleep study.  I will see him in follow-up of the above studies and further recommendations will be made at that time.   Medication Adjustments/Labs and Tests Ordered: Current medicines are reviewed at length with the patient today.  Concerns regarding medicines are outlined above.  Medication changes, Labs and Tests ordered today are listed  in the Patient Instructions below. Patient Instructions  Medication Instructions:  No changes at this time *If you need a refill on your cardiac medications before your next appointment, please call your pharmacy*   Lab Work: Fasting lipid profile If you have labs (blood work) drawn today and your tests are completely normal, you will receive your results only by: Marland Kitchen MyChart Message (if you have MyChart) OR . A paper copy in the mail If you have any lab test that is abnormal or we need to change your treatment, we will call you to review the results.   Testing/Procedures: Your physician has recommended that you have a sleep study. This test records several body functions during sleep, including: brain activity, eye movement, oxygen and carbon dioxide blood levels, heart rate and rhythm, breathing rate and rhythm, the flow of air through your mouth and nose, snoring, body muscle movements, and chest and belly movement. Our sleep coordinators will call you with more information regarding this study.  Your physician has requested that you have an exercise tolerance test. For further information please visit [email or web address] Please also follow instruction sheet, as given. This will take place at [address], [address].  Do not drink or eat foods with caffeine for 24 hours before the test. (Chocolate, coffee, tea, or energy drinks)  If you use an inhaler, bring it with you to the test.  Do not smoke for 4 hours before the test.  Wear comfortable shoes and  clothing.    Follow-Up: At [facility], you and your health needs are our priority.  As part of our continuing mission to provide you with exceptional heart care, we have created designated Provider Care Teams.  These Care Teams include your primary Cardiologist (physician) and Advanced Practice Providers (APPs -  Physician Assistants and Nurse Practitioners) who all work together to provide you with the care you need, when you need it.  We recommend signing up for the patient portal called "MyChart".  Sign up information is provided on this After Visit Summary.  MyChart is used to connect with patients for Virtual Visits (Telemedicine).  Patients are able to view lab/test results, encounter notes, upcoming appointments, etc.  Non-urgent messages can be sent to your provider as well.   To learn more about what you can do with MyChart, go to [email or web address].    Your next appointment:   We will follow up with you based on the results of these tests   Other Instructions None     Signed, Nicki Guadalajara, MD  07/22/2020 1:24 PM    [facility] [address], [address], [place], [place]  [address] Phone: [phone]

## 2020-07-22 NOTE — Telephone Encounter (Signed)
No PA required for HST.  Waiting on sleep lab to call back to schedule. 

## 2020-07-25 NOTE — Telephone Encounter (Signed)
Called the patient and told him he was scheduled for Wednesday, Jan. 26 at 1 pm, to expect info packet and gave him the sleep lab number.  He said he would have to change the appt to a different day.

## 2020-07-26 ENCOUNTER — Other Ambulatory Visit (HOSPITAL_COMMUNITY)
Admission: RE | Admit: 2020-07-26 | Discharge: 2020-07-26 | Disposition: A | Discharge status: Home / Self Care | Payer: 59 | Source: Ambulatory Visit | Attending: Cardiovascular Disease | Admitting: Cardiovascular Disease

## 2020-07-26 DIAGNOSIS — U071 COVID-19: Secondary | ICD-10-CM | POA: Diagnosis not present

## 2020-07-26 DIAGNOSIS — Z01812 Encounter for preprocedural laboratory examination: Secondary | ICD-10-CM | POA: Insufficient documentation

## 2020-07-26 LAB — SARS CORONAVIRUS 2 (TAT 6-24 HRS): SARS Coronavirus 2: POSITIVE — AB

## 2020-07-29 ENCOUNTER — Inpatient Hospital Stay (HOSPITAL_COMMUNITY): Admission: RE | Admit: 2020-07-29 | Payer: 59 | Source: Ambulatory Visit

## 2020-08-08 ENCOUNTER — Other Ambulatory Visit: Payer: Self-pay | Admitting: Family Medicine

## 2020-08-08 MED ORDER — AZITHROMYCIN 250 MG PO TABS: ORAL_TABLET | ORAL | 0 refills | Status: DC

## 2020-08-10 ENCOUNTER — Ambulatory Visit (HOSPITAL_BASED_OUTPATIENT_CLINIC_OR_DEPARTMENT_OTHER): Payer: 59 | Admitting: Cardiovascular Disease

## 2020-08-10 NOTE — Addendum Note (Signed)
Addended by: Alyson Ingles on: 08/10/2020 07:24 AM   Modules accepted: Orders

## 2020-08-18 ENCOUNTER — Telehealth (HOSPITAL_COMMUNITY): Payer: Self-pay | Admitting: *Deleted

## 2020-08-18 NOTE — Telephone Encounter (Signed)
Close encounter 

## 2020-08-19 ENCOUNTER — Ambulatory Visit (HOSPITAL_COMMUNITY)
Admission: RE | Admit: 2020-08-19 | Discharge: 2020-08-19 | Disposition: A | Discharge status: Home / Self Care | Payer: 59 | Source: Ambulatory Visit | Attending: Internal Medicine | Admitting: Internal Medicine

## 2020-08-19 ENCOUNTER — Other Ambulatory Visit: Payer: Self-pay

## 2020-08-19 DIAGNOSIS — R0789 Other chest pain: Secondary | ICD-10-CM | POA: Diagnosis not present

## 2020-08-19 LAB — EXERCISE TOLERANCE TEST
Estimated workload: 15.4 METS
Exercise duration (min): 13 min
Exercise duration (sec): 0 s
MPHR: 177 {beats}/min
Peak HR: 184 {beats}/min
Percent HR: 103 %
Rest HR: 76 {beats}/min

## 2020-08-19 NOTE — Addendum Note (Signed)
Addended by: Nicki Guadalajara A on: 08/19/2020 08:19 AM   Modules accepted: Orders

## 2020-08-19 NOTE — Addendum Note (Signed)
Addended by: Bea Laura B on: 08/19/2020 08:17 AM   Modules accepted: Orders

## 2020-09-06 ENCOUNTER — Ambulatory Visit (HOSPITAL_BASED_OUTPATIENT_CLINIC_OR_DEPARTMENT_OTHER): Payer: 59 | Attending: Cardiovascular Disease | Admitting: Cardiovascular Disease

## 2020-11-14 ENCOUNTER — Ambulatory Visit: Payer: 59 | Admitting: Cardiovascular Disease

## 2020-11-24 ENCOUNTER — Other Ambulatory Visit: Payer: Self-pay

## 2020-11-24 ENCOUNTER — Ambulatory Visit (INDEPENDENT_AMBULATORY_CARE_PROVIDER_SITE_OTHER): Payer: 59 | Admitting: Family Medicine

## 2020-11-24 VITALS — BP 131/82 | HR 73 | Ht 70.0 in | Wt 250.2 lb

## 2020-11-24 DIAGNOSIS — Z0289 Encounter for other administrative examinations: Secondary | ICD-10-CM

## 2020-11-24 NOTE — Progress Notes (Signed)
   Office Visit Note   Patient: Howard Wood           Date of Birth: 1975/11/08           MRN: [id number] Visit Date: 11/24/2020 Requested by: Lavada Mesi, MD [id number] [place],  [place] [address] PCP: Lavada Mesi, MD  Subjective: Chief Complaint  Patient presents with  . Other    DOT PE    HPI: He is here for a DOT physical.  He takes Lexapro for improved focus, not for depression.  He does not take Xanax any longer.  He is not on any other medications.  He has no chronic problems otherwise, no current complaints.  He works as a IT sales professional.              ROS: His chest tightness/shortness of breath has resolved.  All other systems were reviewed and are negative.  Objective: Vital Signs: BP 131/82 (BP Location: Left Arm, Patient Position: Sitting, Cuff Size: Large)   Pulse 73   Ht 5\' 10"  (1.778 m)   Wt 250 lb 3.2 oz (113.5 kg)   BMI 35.90 kg/m   Physical Exam:  General:  Alert and oriented, in no acute distress. Pulm:  Breathing unlabored. Psy:  Normal mood, congruent affect. Skin: No suspicious lesions HEENT:  [place]/AT, PERRLA, EOM Full, no nystagmus.  Funduscopic examination within normal limits.  No conjunctival erythema.  Tympanic membranes are pearly gray with normal landmarks.  External ear canals are normal.  Nasal passages are clear.  Oropharynx is clear.  No significant lymphadenopathy.  No thyromegaly or nodules.  2+ carotid pulses without bruits. CV: Regular rate and rhythm without murmurs, rubs, or gallops.  No peripheral edema.  2+ radial and posterior tibial pulses. Lungs: Clear to auscultation throughout with no wheezing or areas of consolidation. Abd: Bowel sounds are active, no hepatosplenomegaly or masses.  Soft and nontender.  No audible bruits.  No evidence of ascites.   Imaging: No results found.  Assessment & Plan: 1.  DOT examination, within normal limits. -Paperwork completed for a 2-year certificate.  Return at that time for  recheck.     Procedures: No procedures performed        PMFS History: Patient Active Problem List   Diagnosis Date Noted  . Plantar fasciitis of right foot 08/07/2016  . PULMONARY FUNCTION TESTS, ABNORMAL 11/30/2008   No past medical history on file.  No family history on file.  No past surgical history on file. Social History   Occupational History  . Not on file  Tobacco Use  . Smoking status: Never Smoker  . Smokeless tobacco: Never Used  Substance and Sexual Activity  . Alcohol use: Not on file  . Drug use: Not on file  . Sexual activity: Not on file

## 2020-11-24 NOTE — Progress Notes (Signed)
Vision (uncorrected): OD 20/25 OS 20/25-2 OU 20/20 Color - passed

## 2021-10-25 IMAGING — CR DG CHEST 2V
2 series · 2 of 2 positions shown · non-contrast
Comparison: April 03, 2013

CLINICAL DATA: Chest pain

EXAM:
CHEST - 2 VIEW

[chest pa]
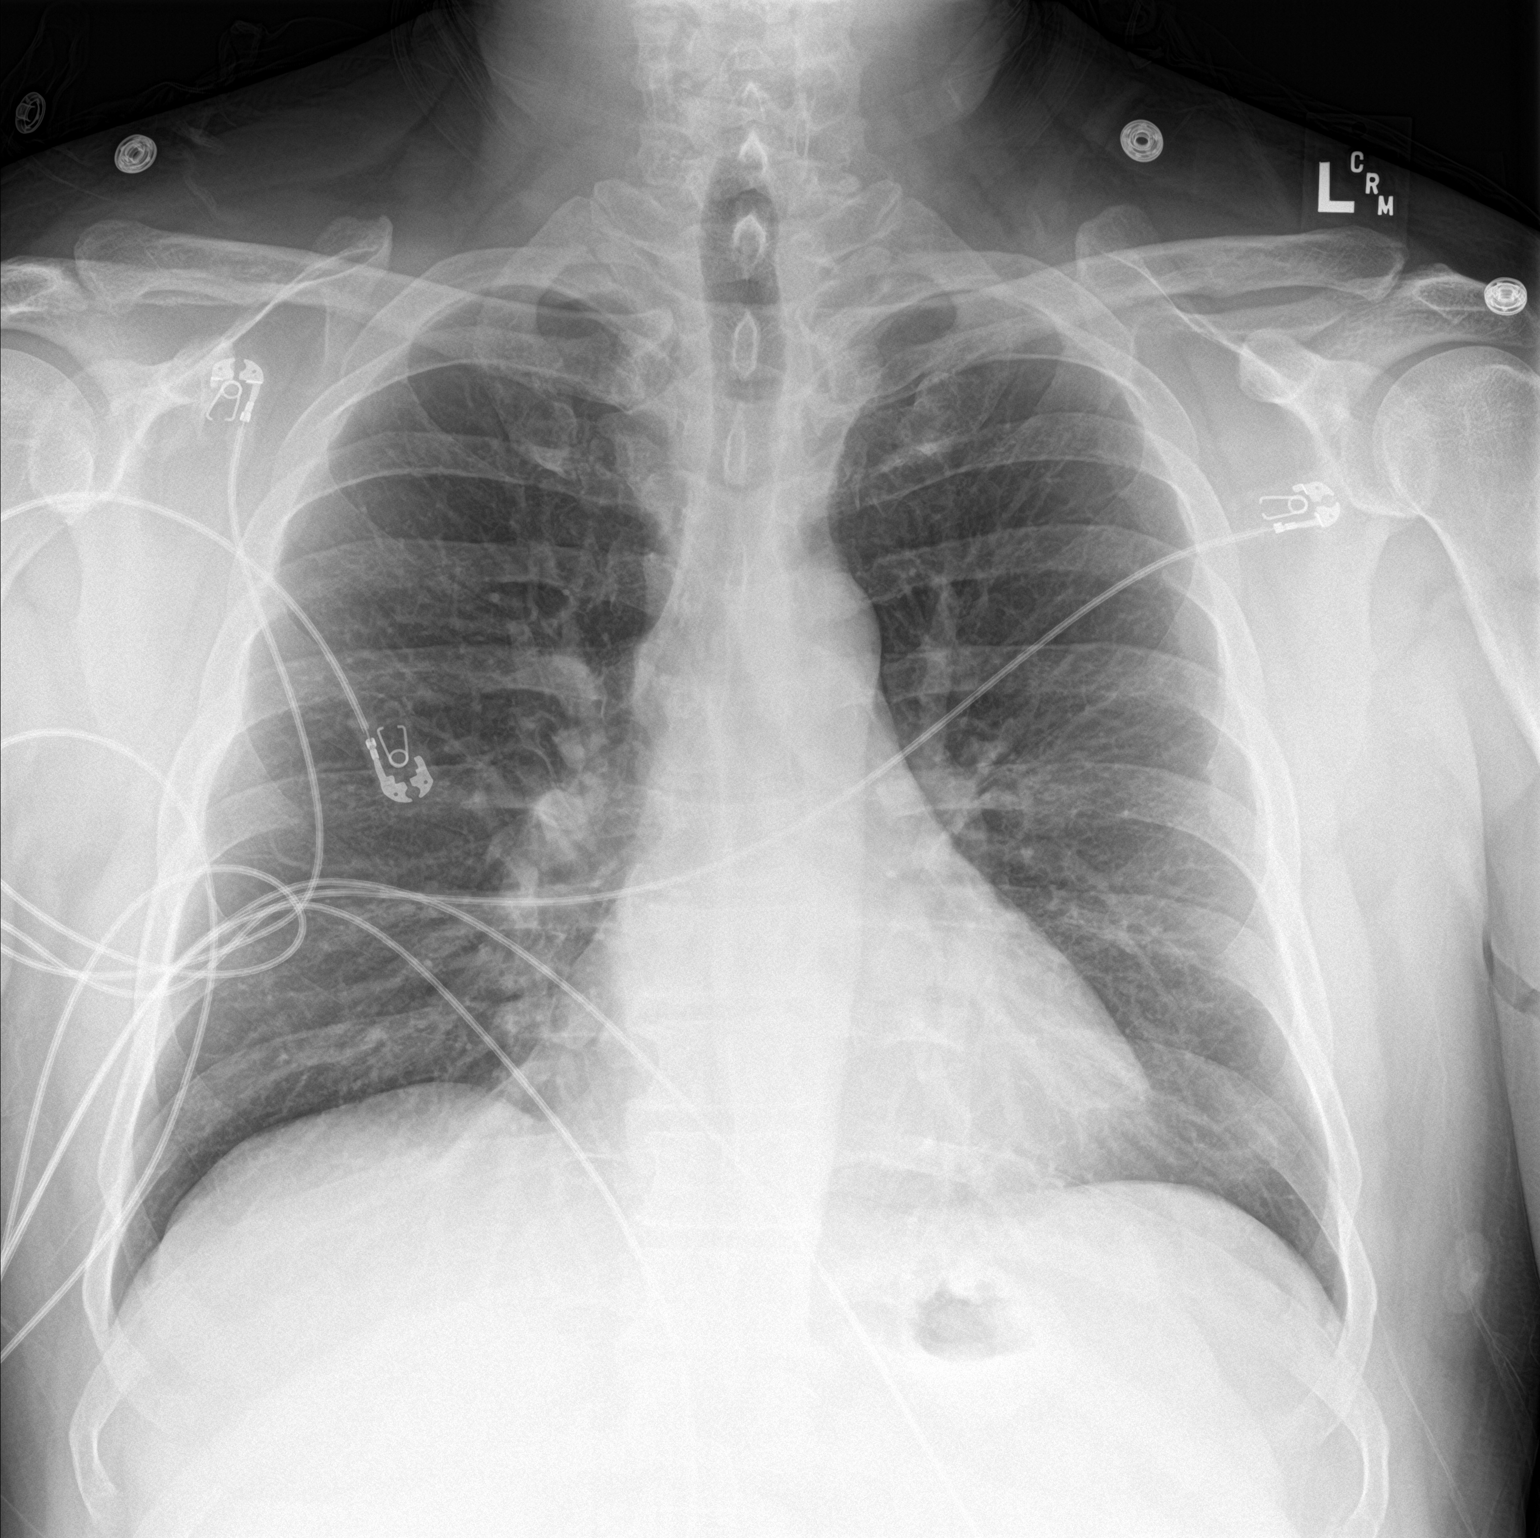

[chest lat]
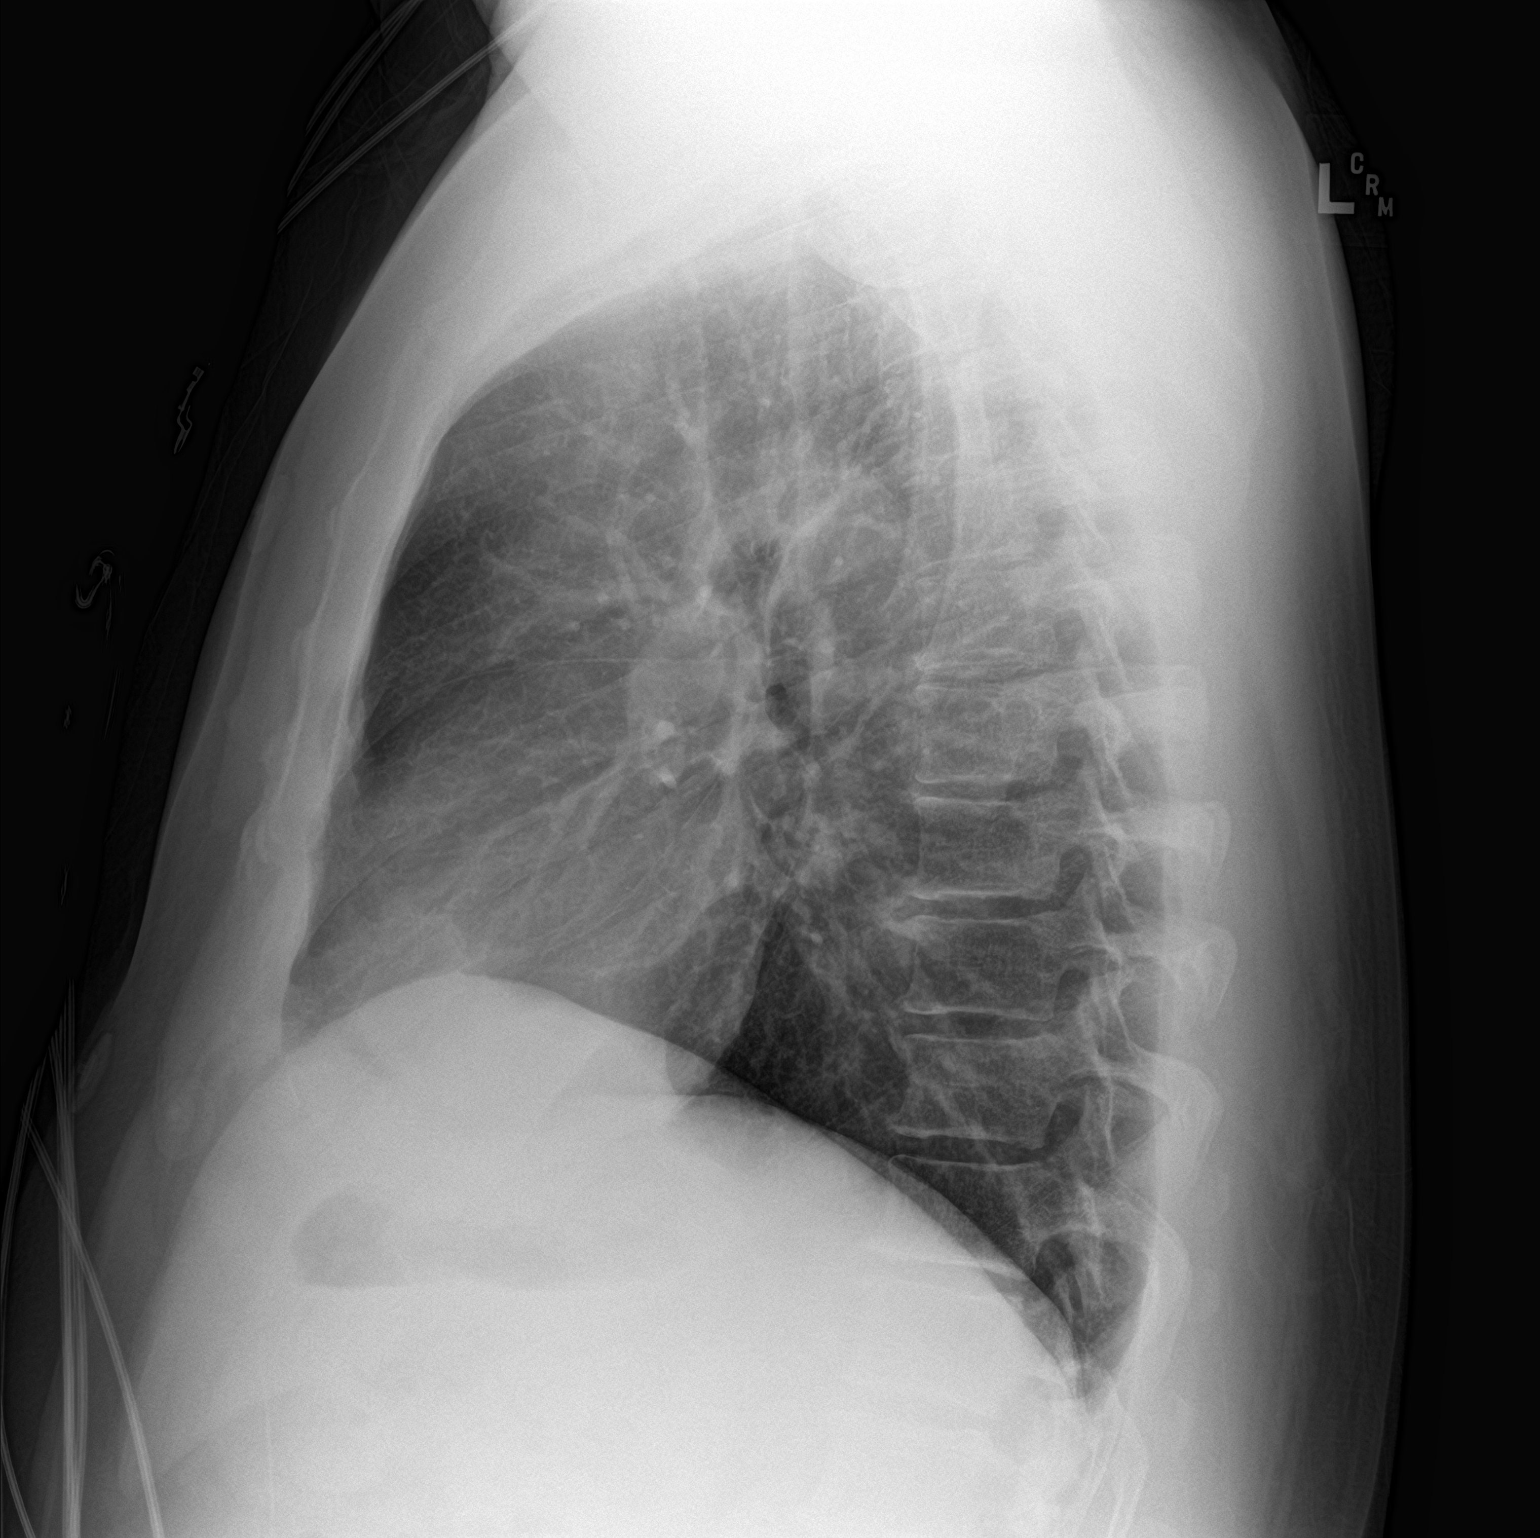

[2 of 2 positions shown; findings below may reference images not displayed]

FINDINGS: Lungs are clear. Heart size and pulmonary vascularity are normal. No
adenopathy. No pneumothorax. No bone lesions.
IMPRESSION: Lungs clear.  Cardiac silhouette normal.

## 2024-05-12 ENCOUNTER — Other Ambulatory Visit (HOSPITAL_BASED_OUTPATIENT_CLINIC_OR_DEPARTMENT_OTHER): Payer: Self-pay | Admitting: Family Medicine

## 2024-05-12 DIAGNOSIS — Z8249 Family history of ischemic heart disease and other diseases of the circulatory system: Secondary | ICD-10-CM

## 2024-06-09 ENCOUNTER — Ambulatory Visit (HOSPITAL_BASED_OUTPATIENT_CLINIC_OR_DEPARTMENT_OTHER)
Admission: RE | Admit: 2024-06-09 | Discharge: 2024-06-09 | Disposition: A | Discharge status: Home / Self Care | Payer: Self-pay | Source: Ambulatory Visit | Attending: Family Medicine | Admitting: Family Medicine

## 2024-06-09 DIAGNOSIS — Z8249 Family history of ischemic heart disease and other diseases of the circulatory system: Secondary | ICD-10-CM
